# Patient Record
Sex: Female | Born: 1995 | Race: Black or African American | Hispanic: No | Marital: Single | Smoking: Never smoker
Health system: Southern US, Community
[De-identification: ages and names within clinical notes are randomized; demographics above are authoritative.]

## PROBLEM LIST (undated history)

## (undated) DIAGNOSIS — Z789 Other specified health status: Secondary | ICD-10-CM

## (undated) HISTORY — PX: NO PAST SURGERIES: SHX2092

---
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
## 2018-07-24 ENCOUNTER — Emergency Department (HOSPITAL_COMMUNITY): Payer: BC Managed Care – PPO

## 2018-07-24 ENCOUNTER — Other Ambulatory Visit: Payer: Self-pay

## 2018-07-24 ENCOUNTER — Encounter (HOSPITAL_COMMUNITY): Payer: Self-pay

## 2018-07-24 ENCOUNTER — Emergency Department (HOSPITAL_COMMUNITY)
Admission: EM | Admit: 2018-07-24 | Discharge: 2018-07-24 | Disposition: A | Discharge status: Home / Self Care | Payer: BC Managed Care – PPO | Attending: Emergency Medicine | Admitting: Emergency Medicine

## 2018-07-24 DIAGNOSIS — Y999 Unspecified external cause status: Secondary | ICD-10-CM | POA: Insufficient documentation

## 2018-07-24 DIAGNOSIS — Y9389 Activity, other specified: Secondary | ICD-10-CM | POA: Insufficient documentation

## 2018-07-24 DIAGNOSIS — J45909 Unspecified asthma, uncomplicated: Secondary | ICD-10-CM | POA: Insufficient documentation

## 2018-07-24 DIAGNOSIS — W25XXXA Contact with sharp glass, initial encounter: Secondary | ICD-10-CM | POA: Insufficient documentation

## 2018-07-24 DIAGNOSIS — Z23 Encounter for immunization: Secondary | ICD-10-CM | POA: Insufficient documentation

## 2018-07-24 DIAGNOSIS — S61215A Laceration without foreign body of left ring finger without damage to nail, initial encounter: Secondary | ICD-10-CM

## 2018-07-24 DIAGNOSIS — Y929 Unspecified place or not applicable: Secondary | ICD-10-CM | POA: Insufficient documentation

## 2018-07-24 DIAGNOSIS — S61214A Laceration without foreign body of right ring finger without damage to nail, initial encounter: Secondary | ICD-10-CM | POA: Insufficient documentation

## 2018-07-24 MED ORDER — ACETAMINOPHEN 325 MG PO TABS
650.0000 mg | ORAL_TABLET | Freq: Once | ORAL | Status: AC
  Administered 2018-07-24: 650 mg via ORAL
  Filled 2018-07-24: qty 2

## 2018-07-24 MED ORDER — LIDOCAINE HCL (PF) 1 % IJ SOLN
5.0000 mL | Freq: Once | INTRAMUSCULAR | Status: AC
  Administered 2018-07-24: 5 mL via INTRADERMAL
  Filled 2018-07-24: qty 5

## 2018-07-24 MED ORDER — TETANUS-DIPHTH-ACELL PERTUSSIS 5-2.5-18.5 LF-MCG/0.5 IM SUSP
0.5000 mL | Freq: Once | INTRAMUSCULAR | Status: AC
  Administered 2018-07-24: 22:00:00 0.5 mL via INTRAMUSCULAR
  Filled 2018-07-24: qty 0.5

## 2018-07-24 NOTE — ED Notes (Signed)
ED Provider at bedside. 

## 2018-07-24 NOTE — ED Provider Notes (Signed)
[facility] EMERGENCY DEPARTMENT Provider Note   CSN: [id number] Arrival date & time: 07/24/18  2045    History   Chief Complaint Chief Complaint  Patient presents with  . Hand Injury    HPI Bethany Kramer is a 23 y.o. female.     23 y.o female with no PMH presents to the ED with a chief complaint of right hand laceration x1 hour ago.  Patient reports she was attempting to place a mirror, mirror fell landing on her right hand and cutting the base of her right ring finger.  Patient reports taking a shot of alcohol for relieving symptoms, no improvement noted.  Patient reports pain along the base of the right metacarpal, has full flexion and extension of the finger.  Patient reports she is unsure when her last tetanus shot was received.  She denies any other complaints at this time.     History reviewed. No pertinent past medical history.  Patient Active Problem List   Diagnosis Date Noted  . ALLERGIC RHINITIS, SEASONAL 11/26/2007  . ASTHMA 11/26/2007  . ECZEMA 11/26/2007    History reviewed. No pertinent surgical history.   OB History   No obstetric history on file.      Home Medications    Prior to Admission medications   Not on File    Family History No family history on file.  Social History Social History   Tobacco Use  . Smoking status: Not on file  Substance Use Topics  . Alcohol use: Not on file  . Drug use: Not on file     Allergies   Patient has no allergy information on record.   Review of Systems Review of Systems  Constitutional: Negative for fever.  Musculoskeletal: Negative for arthralgias and joint swelling.  Skin: Positive for wound.     Physical Exam Updated Vital Signs BP (!) 135/95 (BP Location: Left Arm)   Pulse 76   Temp 98.5 F (36.9 C) (Oral)   Resp 18   SpO2 99%   Physical Exam Vitals signs and nursing note reviewed.  Constitutional:      General: She is not in acute distress.    Appearance:  She is well-developed.  HENT:     Head: Normocephalic and atraumatic.     Mouth/Throat:     Pharynx: No oropharyngeal exudate.  Eyes:     Pupils: Pupils are equal, round, and reactive to light.  Neck:     Musculoskeletal: Normal range of motion.  Cardiovascular:     Rate and Rhythm: Regular rhythm.     Heart sounds: Normal heart sounds.  Pulmonary:     Effort: Pulmonary effort is normal. No respiratory distress.     Breath sounds: Normal breath sounds.  Abdominal:     General: Bowel sounds are normal. There is no distension.     Palpations: Abdomen is soft.     Tenderness: There is no abdominal tenderness.  Musculoskeletal:        General: No deformity.     Right hand: She exhibits tenderness and laceration. She exhibits normal range of motion, no bony tenderness, normal capillary refill and no deformity. Normal sensation noted. Normal strength noted. She exhibits no finger abduction, no thumb/finger opposition and no wrist extension trouble.       Hands:     Right lower leg: No edema.     Left lower leg: No edema.  Skin:    General: Skin is warm and dry.  Neurological:     Mental Status: She is alert and oriented to person, place, and time.      ED Treatments / Results  Labs (all labs ordered are listed, but only abnormal results are displayed) Labs Reviewed - No data to display  EKG None  Radiology Dg Finger Ring Right  Result Date: 07/24/2018 CLINICAL DATA:  23 y/o F; cut right hand on a mere. Controlled bleeding. EXAM: RIGHT RING FINGER 2+V COMPARISON:  None. FINDINGS: There is no evidence of fracture or dislocation. There is no evidence of arthropathy or other focal bone abnormality. No radiopaque foreign body identified. IMPRESSION: No acute fracture or dislocation.  No radiopaque foreign body. Electronically Signed   By: Mitzi HansenLance  Furusawa-Stratton M.D.   On: 07/24/2018 21:57    Procedures .Marland Kitchen.Laceration Repair Date/Time: 07/24/2018 11:05 PM Performed by: Claude MangesSoto,  Narek Kniss, PA-C Authorized by: Claude MangesSoto, Katrina Daddona, PA-C   Consent:    Consent obtained:  Verbal   Consent given by:  Patient   Risks discussed:  Infection, pain and poor cosmetic result Anesthesia (see MAR for exact dosages):    Anesthesia method:  Local infiltration   Local anesthetic:  Lidocaine 1% w/o epi Laceration details:    Location:  Finger   Finger location:  L ring finger   Length (cm):  2   Depth (mm):  0.5 Repair type:    Repair type:  Simple Exploration:    Hemostasis achieved with:  Direct pressure Treatment:    Area cleansed with:  Saline   Amount of cleaning:  Extensive   Irrigation solution:  Sterile water Skin repair:    Repair method:  Sutures   Suture size:  4-0   Suture technique:  Simple interrupted Approximation:    Approximation:  Close Post-procedure details:    Dressing:  Antibiotic ointment and splint for protection   Patient tolerance of procedure:  Tolerated well, no immediate complications   (including critical care time)  Medications Ordered in ED Medications  acetaminophen (TYLENOL) tablet 650 mg (650 mg Oral Given 07/24/18 2133)  Tdap (BOOSTRIX) injection 0.5 mL (0.5 mLs Intramuscular Given 07/24/18 2134)  lidocaine (PF) (XYLOCAINE) 1 % injection 5 mL (5 mLs Intradermal Given 07/24/18 2150)     Initial Impression / Assessment and Plan / ED Course  I have reviewed the triage vital signs and the nursing notes.  Pertinent labs & imaging results that were available during my care of the patient were reviewed by me and considered in my medical decision making (see chart for details).    Patient with no PMH presents to the ED s/p right ring finger injury after attempting to hang a mirror, reports cutting her finger at the base of the metacarpal fourth digit. Patient has full range of motion to her finger, sensation is intact.  Will obtain x-ray to rule out any foreign body, or other deformities.  X-ray showed no acute fracture, dislocation or foreign  body present.  Patient has full mobility of left ring finger, will repair with sutures along with place patient on splint. I have placed 2 sutures to patient's left ring finger, procedure was tolerated well she will be placed on a splint along with have these removed within 10 to 14 days.  She is instructed if she experiences a fever, drainage from the wound or decreased sensation to her finger she needs to return to the emergency department.  Patient understands and agrees with management.  Return precautions provided.   Portions of this note were generated  with Scientist, clinical (histocompatibility and immunogenetics). Dictation errors may occur despite best attempts at proofreading.    Final Clinical Impressions(s) / ED Diagnoses   Final diagnoses:  Laceration of left ring finger without foreign body without damage to nail, initial encounter    ED Discharge Orders    None       Claude Manges, PA-C 07/24/18 2312    Melene Plan, DO 07/28/18 [phone]

## 2018-07-24 NOTE — ED Triage Notes (Signed)
Pt cut her right hand on a mirror trying to hang it. Bleeding controlled, unknown tetanus

## 2018-07-24 NOTE — Discharge Instructions (Addendum)
I have placed 2 sutures to your left ring finger, please have these removed within 10-14 days. Your finger has also been placed, please keep this clean and dry.If you experience any drainage, fever, or decrease sensation to the wound please return to the ED.

## 2018-08-18 ENCOUNTER — Other Ambulatory Visit: Payer: Self-pay

## 2018-08-18 ENCOUNTER — Encounter (HOSPITAL_COMMUNITY): Payer: Self-pay

## 2018-08-18 ENCOUNTER — Emergency Department (HOSPITAL_COMMUNITY)
Admission: EM | Admit: 2018-08-18 | Discharge: 2018-08-18 | Disposition: A | Discharge status: Home / Self Care | Payer: Self-pay | Attending: Emergency Medicine | Admitting: Emergency Medicine

## 2018-08-18 DIAGNOSIS — Z4802 Encounter for removal of sutures: Secondary | ICD-10-CM | POA: Insufficient documentation

## 2018-08-18 HISTORY — DX: Other specified health status: Z78.9

## 2018-08-18 NOTE — Discharge Instructions (Addendum)
Continue to wash daily with soap and water.  Return to the ER if you develop fevers, pus draining from the cut, numbness, or any new, worsening, or concerning symptoms.

## 2018-08-18 NOTE — ED Triage Notes (Signed)
Pt here to have two stitches removed in right hand, 4th digit.

## 2018-08-18 NOTE — ED Provider Notes (Signed)
[facility] EMERGENCY DEPARTMENT Provider Note   CSN: [id number] Arrival date & time: 08/18/18  1950    History   Chief Complaint Chief Complaint  Patient presents with  . Suture / Staple Removal    HPI Bethany Kramer is a 23 y.o. female presenting for suture removal.  Patient states she cut her right ring finger on a mirror about 3 weeks ago.  She had 2 stitches placed.  She states she has not been back to get them removed because she does not like hospitals.  She denies any issues with the current, no tenderness, drainage, redness, or signs of infection.  She has no numbness of her finger, has full active range of motion without difficulty.     HPI  Past Medical History:  Diagnosis Date  . Known health problems: none     Patient Active Problem List   Diagnosis Date Noted  . ALLERGIC RHINITIS, SEASONAL 11/26/2007  . ASTHMA 11/26/2007  . ECZEMA 11/26/2007    Past Surgical History:  Procedure Laterality Date  . NO PAST SURGERIES       OB History   No obstetric history on file.      Home Medications    Prior to Admission medications   Not on File    Family History History reviewed. No pertinent family history.  Social History Social History   Tobacco Use  . Smoking status: Never Smoker  . Smokeless tobacco: Never Used  Substance Use Topics  . Alcohol use: Yes  . Drug use: Yes    Types: Marijuana     Allergies   Patient has no known allergies.   Review of Systems Review of Systems  Skin: Positive for wound.  Neurological: Negative for numbness.     Physical Exam Updated Vital Signs BP 108/75 (BP Location: Left Arm)   Pulse 80   Temp 98.2 F (36.8 C) (Oral)   Resp 16   SpO2 100%   Physical Exam Vitals signs and nursing note reviewed.  Constitutional:      General: She is not in acute distress.    Appearance: She is well-developed.  HENT:     Head: Normocephalic and atraumatic.  Neck:     Musculoskeletal:  Normal range of motion.  Pulmonary:     Effort: Pulmonary effort is normal.  Abdominal:     General: There is no distension.  Musculoskeletal: Normal range of motion.  Skin:    General: Skin is warm.     Capillary Refill: Capillary refill takes less than 2 seconds.     Findings: No rash.     Comments: Well healed laceration on the palmar aspect of the right ring finger at the base of the finger.  No surrounding tenderness, erythema, pus, fluctuance, or induration.  Full active range of motion of the finger.  Distal sensation intact.  Neurological:     Mental Status: She is alert and oriented to person, place, and time.      ED Treatments / Results  Labs (all labs ordered are listed, but only abnormal results are displayed) Labs Reviewed - No data to display  EKG None  Radiology No results found.  Procedures .Suture Removal Date/Time: 08/18/2018 8:22 PM Performed by: Alveria Apley, PA-C Authorized by: Alveria Apley, PA-C   Consent:    Consent obtained:  Verbal   Consent given by:  Patient   Risks discussed:  Bleeding, pain and wound separation Location:    Location:  Upper extremity  Upper extremity location:  Hand   Hand location:  R ring finger Procedure details:    Wound appearance:  No signs of infection, good wound healing, clean and nontender   Number of sutures removed:  2 Post-procedure details:    Post-removal:  No dressing applied   Patient tolerance of procedure:  Tolerated well, no immediate complications   (including critical care time)  Medications Ordered in ED Medications - No data to display   Initial Impression / Assessment and Plan / ED Course  I have reviewed the triage vital signs and the nursing notes.  Pertinent labs & imaging results that were available during my care of the patient were reviewed by me and considered in my medical decision making (see chart for details).        Patient presenting for suture removal.   Physical exam reassuring, no signs of infection.  Shows well-healed laceration.  Patient is late for suture removal, as it has been several weeks.  However sutures were removed easily.  Aftercare instructions given.  At this time, patient appears safe for discharge.  Return precautions given.  Patient states she understands agrees plan.   Final Clinical Impressions(s) / ED Diagnoses   Final diagnoses:  Visit for suture removal    ED Discharge Orders    None       Alveria ApleyCaccavale, Jahnasia Tatum, PA-C 08/18/18 2024    Wynetta FinesMessick, Peter C, MD 08/19/18 [phone]

## 2018-08-18 NOTE — ED Notes (Signed)
Patient verbalizes understanding of discharge instructions. Opportunity for questioning and answers were provided. Armband removed by staff, pt discharged from ED. Pt ambulatory to lobby.  

## 2020-04-10 ENCOUNTER — Other Ambulatory Visit (HOSPITAL_COMMUNITY)
Admission: RE | Admit: 2020-04-10 | Discharge: 2020-04-10 | Disposition: A | Discharge status: Home / Self Care | Payer: BC Managed Care – PPO | Source: Ambulatory Visit | Attending: Physician Assistant | Admitting: Physician Assistant

## 2020-04-10 ENCOUNTER — Other Ambulatory Visit: Payer: Self-pay | Admitting: Physician Assistant

## 2020-04-10 DIAGNOSIS — Z124 Encounter for screening for malignant neoplasm of cervix: Secondary | ICD-10-CM | POA: Diagnosis present

## 2020-04-12 LAB — CYTOLOGY - PAP
Adequacy: ABSENT
Diagnosis: NEGATIVE

## 2020-09-29 IMAGING — CR RIGHT RING FINGER 2+V
3 series · 3 of 3 positions shown · non-contrast
Comparison: None.

CLINICAL DATA: 22 y/o F; cut right hand on a mere. Controlled
bleeding.

EXAM:
RIGHT RING FINGER 2+V

[finger ap]
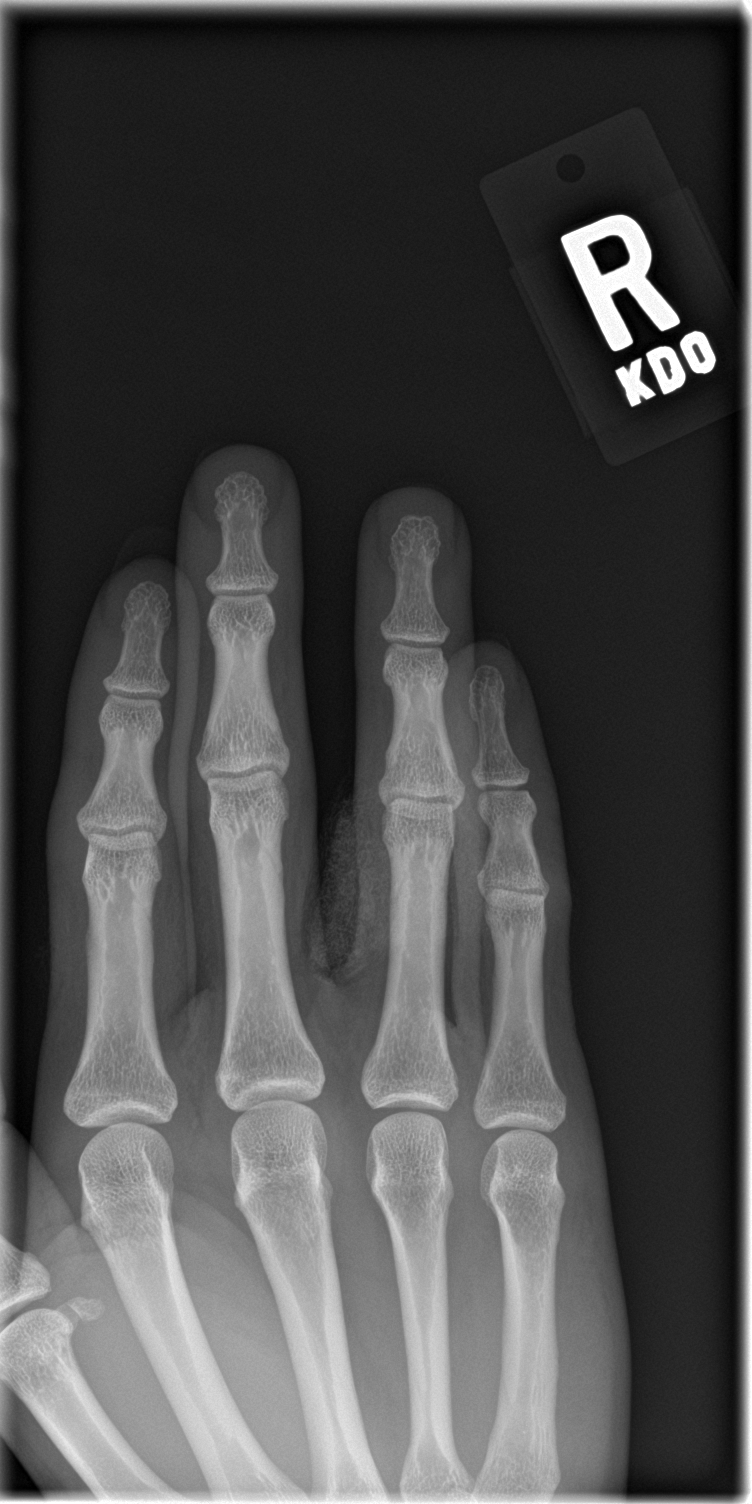

[finger lat]
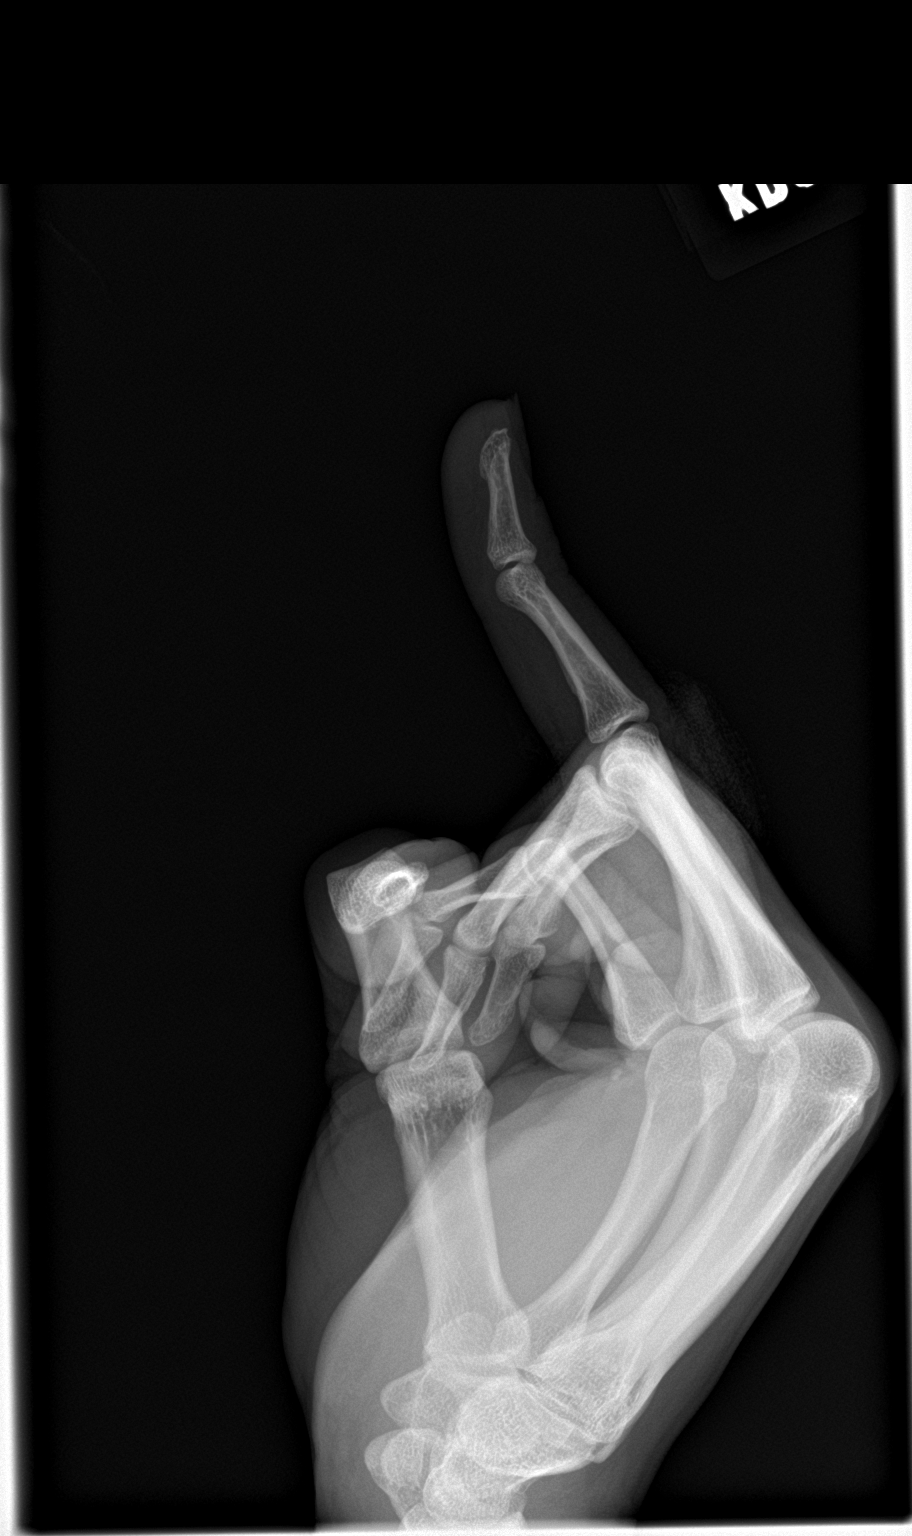

[finger obl]
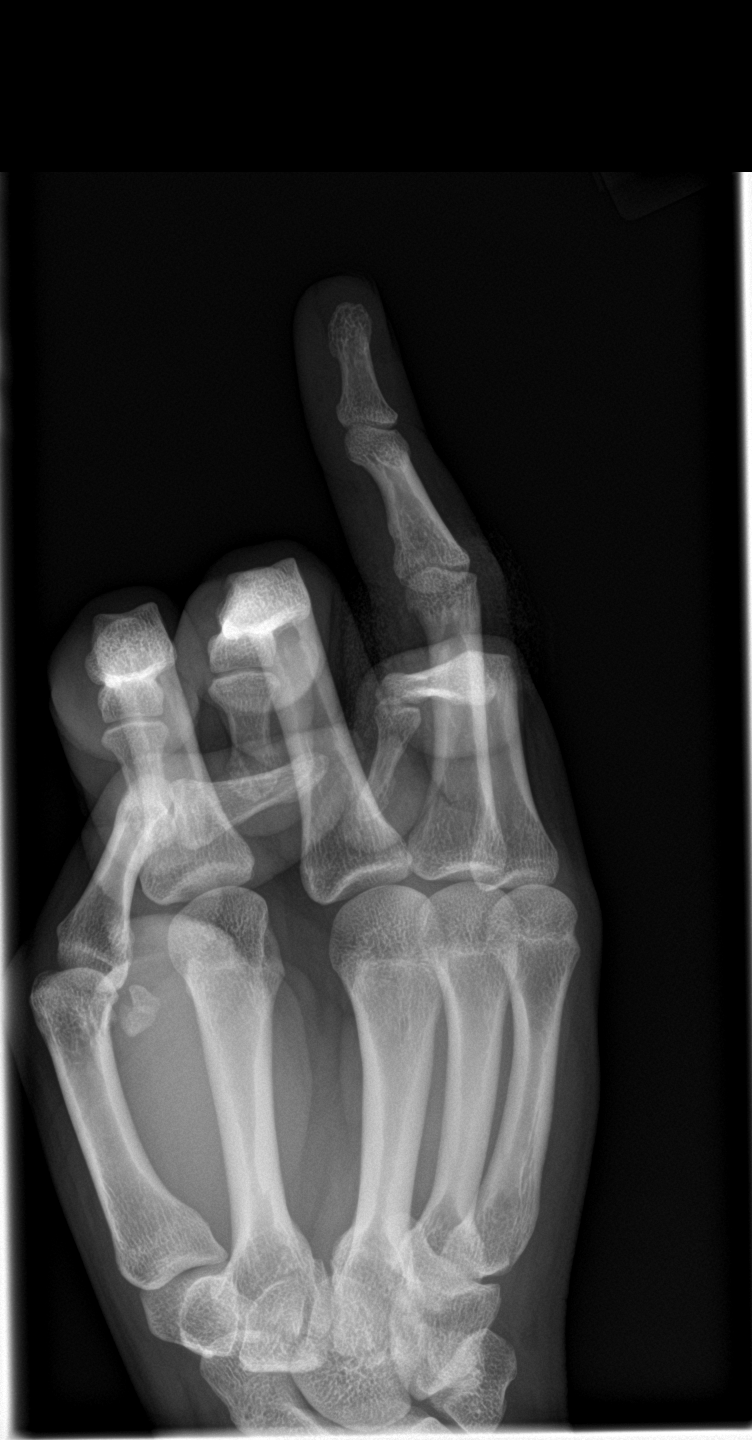

[3 of 3 positions shown; findings below may reference images not displayed]

FINDINGS: There is no evidence of fracture or dislocation. There is no
evidence of arthropathy or other focal bone abnormality. No
radiopaque foreign body identified.
IMPRESSION: No acute fracture or dislocation.  No radiopaque foreign body.

## 2023-03-20 ENCOUNTER — Other Ambulatory Visit: Payer: Self-pay

## 2023-03-20 ENCOUNTER — Emergency Department (HOSPITAL_BASED_OUTPATIENT_CLINIC_OR_DEPARTMENT_OTHER)
Admission: EM | Admit: 2023-03-20 | Discharge: 2023-03-21 | Disposition: A | Discharge status: Home / Self Care | Payer: Medicaid Other | Attending: Emergency Medicine | Admitting: Emergency Medicine

## 2023-03-20 DIAGNOSIS — L02415 Cutaneous abscess of right lower limb: Secondary | ICD-10-CM | POA: Diagnosis not present

## 2023-03-20 DIAGNOSIS — L03115 Cellulitis of right lower limb: Secondary | ICD-10-CM | POA: Insufficient documentation

## 2023-03-20 DIAGNOSIS — J45909 Unspecified asthma, uncomplicated: Secondary | ICD-10-CM | POA: Diagnosis not present

## 2023-03-20 DIAGNOSIS — L03119 Cellulitis of unspecified part of limb: Secondary | ICD-10-CM

## 2023-03-20 DIAGNOSIS — M7989 Other specified soft tissue disorders: Secondary | ICD-10-CM | POA: Diagnosis present

## 2023-03-20 DIAGNOSIS — L02419 Cutaneous abscess of limb, unspecified: Secondary | ICD-10-CM

## 2023-03-20 NOTE — ED Triage Notes (Signed)
 Pt POV from home reporting cyst on R thigh and L buttock. Endorses drainage of L buttock. Painful to sit.

## 2023-03-21 MED ORDER — LIDOCAINE-EPINEPHRINE (PF) 2 %-1:200000 IJ SOLN
20.0000 mL | Freq: Once | INTRAMUSCULAR | Status: AC
  Administered 2023-03-21: 20 mL
  Filled 2023-03-21: qty 20

## 2023-03-21 MED ORDER — CLINDAMYCIN HCL 150 MG PO CAPS
300.0000 mg | ORAL_CAPSULE | Freq: Once | ORAL | Status: AC
  Administered 2023-03-21: 300 mg via ORAL
  Filled 2023-03-21: qty 2

## 2023-03-21 MED ORDER — CLINDAMYCIN HCL 150 MG PO CAPS: 150.0000 mg | ORAL_CAPSULE | Freq: Four times a day (QID) | ORAL | 0 refills | Status: AC

## 2023-03-21 NOTE — ED Notes (Signed)
 ED Provider at bedside.

## 2023-03-21 NOTE — ED Provider Notes (Signed)
 [place] EMERGENCY DEPARTMENT AT [facility] Provider Note  CSN: [id number] Arrival date & time: 03/20/23 2251  Chief Complaint(s) Cyst  HPI Bethany Kramer is a 28 y.o. female    The history is provided by the patient.  Abscess Location:  Leg Leg abscess location:  R upper leg Size:  3cm Abscess quality: induration, painful, redness and warmth   Abscess quality: not draining   Red streaking: yes   Duration:  2 days Progression:  Worsening Pain details:    Quality:  Throbbing and sharp   Severity:  Severe   Timing:  Constant Chronicity:  New Context: not diabetes, not immunosuppression, not injected drug use, not insect bite/sting and not skin injury   Relieved by:  Nothing Worsened by:  Nothing   Past Medical History Past Medical History:  Diagnosis Date   Known health problems: none    Patient Active Problem List   Diagnosis Date Noted   ALLERGIC RHINITIS, SEASONAL 11/26/2007   Asthma 11/26/2007   ECZEMA 11/26/2007   Home Medication(s) Prior to Admission medications   Medication Sig Start Date End Date Taking? Authorizing Provider  clindamycin  (CLEOCIN ) 150 MG capsule Take 1 capsule (150 mg total) by mouth every 6 (six) hours for 7 days. 03/21/23 03/28/23 Yes Yordy Matton, Raynell Moder, MD                                                                                                                                    Allergies Patient has no known allergies.  Review of Systems Review of Systems As noted in HPI  Physical Exam Vital Signs  I have reviewed the triage vital signs BP (!) 132/91   Pulse 98   Temp 99 F (37.2 C) (Oral)   Resp 17   Ht 5' 2 (1.575 m)   Wt 68 kg   LMP 03/19/2023 (Exact Date)   SpO2 100%   BMI 27.44 kg/m   Physical Exam Vitals reviewed.  Constitutional:      General: She is not in acute distress.    Appearance: She is well-developed. She is not diaphoretic.  HENT:     Head: Normocephalic and atraumatic.     Right  Ear: External ear normal.     Left Ear: External ear normal.     Nose: Nose normal.  Eyes:     General: No scleral icterus.    Conjunctiva/sclera: Conjunctivae normal.  Neck:     Trachea: Phonation normal.  Cardiovascular:     Rate and Rhythm: Normal rate and regular rhythm.  Pulmonary:     Effort: Pulmonary effort is normal. No respiratory distress.     Breath sounds: No stridor.  Abdominal:     General: There is no distension.  Musculoskeletal:        General: Normal range of motion.     Cervical back: Normal range of motion.  Skin:    Findings: Abscess  and erythema present.       Neurological:     Mental Status: She is alert and oriented to person, place, and time.  Psychiatric:        Behavior: Behavior normal.     ED Results and Treatments Labs (all labs ordered are listed, but only abnormal results are displayed) Labs Reviewed - No data to display                                                                                                                       EKG  EKG Interpretation Date/Time:    Ventricular Rate:    PR Interval:    QRS Duration:    QT Interval:    QTC Calculation:   R Axis:      Text Interpretation:         Radiology No results found.  Medications Ordered in ED Medications  clindamycin  (CLEOCIN ) capsule 300 mg (has no administration in time range)  lidocaine -EPINEPHrine  (XYLOCAINE  W/EPI) 2 %-1:200000 (PF) injection 20 mL (20 mLs Infiltration Given 03/21/23 0028)   Procedures .Ultrasound ED Soft Tissue  Date/Time: 03/21/2023 12:55 AM  Performed by: Trine Raynell Moder, MD Authorized by: Trine Raynell Moder, MD   Procedure details:    Indications: localization of abscess and evaluate for cellulitis     Transverse view:  Visualized   Longitudinal view:  Visualized   Images: archived   Location:    Location: lower extremity     Side:  Right Findings:     abscess present    cellulitis present .Incision and  Drainage  Date/Time: 03/21/2023 12:57 AM  Performed by: Trine Raynell Moder, MD Authorized by: Trine Raynell Moder, MD   Consent:    Consent obtained:  Verbal   Consent given by:  Patient   Risks discussed:  Bleeding, incomplete drainage and infection   Alternatives discussed:  No treatment Universal protocol:    Patient identity confirmed:  Verbally with patient Location:    Type:  Abscess   Size:  3cm   Location:  Lower extremity   Lower extremity location:  Leg   Leg location:  R upper leg Pre-procedure details:    Skin preparation:  Chlorhexidine with alcohol Anesthesia:    Anesthesia method:  Local infiltration   Local anesthetic:  Lidocaine  2% WITH epi Procedure type:    Complexity:  Complex Procedure details:    Ultrasound guidance: yes     Incision types:  Cruciate   Incision depth:  Subcutaneous   Wound management:  Probed and deloculated and debrided   Drainage:  Bloody and purulent   Drainage amount:  Moderate   Wound treatment:  Wound left open   Packing materials:  None Post-procedure details:    Procedure completion:  Tolerated   (including critical care time) Medical Decision Making / ED Course   Medical Decision Making Risk Prescription drug management.    Patient presents for abscess and cellulitis of the right posterior thigh.  I&D as  above.  Will place on antibiotics to treat cellulitis.  Given first dose of clindamycin  in the emergency department.    Final Clinical Impression(s) / ED Diagnoses Final diagnoses:  Cellulitis and abscess of lower extremity   The patient appears reasonably screened and/or stabilized for discharge and I doubt any other medical condition or other [facility] requiring further screening, evaluation, or treatment in the ED at this time. I have discussed the findings, Dx and Tx plan with the patient/family who expressed understanding and agree(s) with the plan. Discharge instructions discussed at length. The patient/family  was given strict return precautions who verbalized understanding of the instructions. No further questions at time of discharge.  Disposition: Discharge  Condition: Good  ED Discharge Orders          Ordered    clindamycin  (CLEOCIN ) 150 MG capsule  Every 6 hours        03/21/23 0057              Follow Up: Primary care provider  Call  to schedule an appointment for close follow up    This chart was dictated using voice recognition software.  Despite best efforts to proofread,  errors can occur which can change the documentation meaning.    Trine Raynell Moder, MD 03/21/23 0100

## 2023-03-21 NOTE — ED Notes (Signed)
 RN reviewed discharge instructions with pt. Pt verbalized understanding and had no further questions. VSS upon discharge.

## 2023-03-21 NOTE — Discharge Instructions (Addendum)
 Thank you for allowing us  to take care of you today.  We hope you begin feeling better soon.  For pain control you may take 1000 mg of acetaminophen  (Tylenol ) every 8 hours and/or 600 mg of Ibuprofen (Motrin, Advil, etc.) every 6-8 hours as needed.  Please limit acetaminophen  (Tylenol ) to 4000 mg and Ibuprofen (Motrin, Advil, etc.) to 2400 mg for a 24hr period. Please note that other over-the-counter medicine may contain acetaminophen  or ibuprofen as a component of their ingredients.    To-Do: Please use warm compressions or soak in warm baths to allow the abscess to continue drainage. Please take your antibiotics as prescribed. Please return to the Emergency Department or call 911 if you experience chest pain, shortness of breath, severe pain, severe fever, altered mental status, or have any reason to think that you need emergency medical care.  Thank you again.  Hope you feel better soon.  Department of Emergency Medicine [facility]

## 2023-06-13 ENCOUNTER — Ambulatory Visit: Payer: Self-pay
